# Patient Record
Sex: Female | Born: 1975 | Race: Black or African American | Hispanic: No | Marital: Single | Smoking: Current every day smoker
Health system: Southern US, Community
[De-identification: ages and names within clinical notes are randomized; demographics above are authoritative.]

## PROBLEM LIST (undated history)

## (undated) DIAGNOSIS — J45909 Unspecified asthma, uncomplicated: Secondary | ICD-10-CM

## (undated) HISTORY — DX: Unspecified asthma, uncomplicated: J45.909

---

## 2009-10-30 ENCOUNTER — Emergency Department: Payer: Self-pay | Admitting: Emergency Medicine

## 2010-12-25 ENCOUNTER — Emergency Department: Payer: Self-pay | Admitting: Emergency Medicine

## 2011-07-02 ENCOUNTER — Emergency Department: Payer: Self-pay | Admitting: Emergency Medicine

## 2011-09-07 ENCOUNTER — Emergency Department: Payer: Self-pay | Admitting: Emergency Medicine

## 2011-09-08 ENCOUNTER — Emergency Department: Payer: Self-pay | Admitting: Emergency Medicine

## 2011-09-08 LAB — PREGNANCY, URINE: Pregnancy Test, Urine: NEGATIVE m[IU]/mL

## 2011-09-08 LAB — URINALYSIS, COMPLETE
Bacteria: NONE SEEN
Glucose,UR: NEGATIVE mg/dL (ref 0–75)
Ketone: NEGATIVE
Nitrite: NEGATIVE
Protein: NEGATIVE
Specific Gravity: 1.003 (ref 1.003–1.030)
Squamous Epithelial: 1
WBC UR: 14 /HPF (ref 0–5)

## 2011-09-08 LAB — COMPREHENSIVE METABOLIC PANEL
Anion Gap: 5 — ABNORMAL LOW (ref 7–16)
Bilirubin,Total: 0.3 mg/dL (ref 0.2–1.0)
Co2: 28 mmol/L (ref 21–32)
Creatinine: 0.74 mg/dL (ref 0.60–1.30)
EGFR (Non-African Amer.): >60
Glucose: 81 mg/dL (ref 65–99)
Osmolality: 281 (ref 275–301)
Potassium: 3.6 mmol/L (ref 3.5–5.1)
SGOT(AST): 17 U/L (ref 15–37)
SGPT (ALT): 14 U/L
Sodium: 143 mmol/L (ref 136–145)
Total Protein: 6.5 g/dL (ref 6.4–8.2)

## 2011-09-08 LAB — CBC
HGB: 10.4 g/dL — ABNORMAL LOW (ref 12.0–16.0)
MCHC: 32.2 g/dL (ref 32.0–36.0)
MCV: 92 fL (ref 80–100)
RBC: 3.52 10*6/uL — ABNORMAL LOW (ref 3.80–5.20)
RDW: 15.9 % — ABNORMAL HIGH (ref 11.5–14.5)

## 2011-09-08 LAB — DRUG SCREEN, URINE
Amphetamines, Ur Screen: NEGATIVE (ref ?–1000)
Barbiturates, Ur Screen: NEGATIVE (ref ?–200)
Benzodiazepine, Ur Scrn: POSITIVE (ref ?–200)
MDMA (Ecstasy)Ur Screen: NEGATIVE (ref ?–500)

## 2011-09-08 LAB — MAGNESIUM: Magnesium: 2.1 mg/dL

## 2013-05-23 ENCOUNTER — Emergency Department: Payer: Self-pay | Admitting: Emergency Medicine

## 2013-05-23 LAB — DRUG SCREEN, URINE
Amphetamines, Ur Screen: NEGATIVE (ref ?–1000)
BARBITURATES, UR SCREEN: NEGATIVE (ref ?–200)
Benzodiazepine, Ur Scrn: NEGATIVE (ref ?–200)
Cannabinoid 50 Ng, Ur ~~LOC~~: POSITIVE (ref ?–50)
Cocaine Metabolite,Ur ~~LOC~~: NEGATIVE (ref ?–300)
MDMA (ECSTASY) UR SCREEN: NEGATIVE (ref ?–500)
Methadone, Ur Screen: NEGATIVE (ref ?–300)
Opiate, Ur Screen: NEGATIVE (ref ?–300)
PHENCYCLIDINE (PCP) UR S: NEGATIVE (ref ?–25)
Tricyclic, Ur Screen: NEGATIVE (ref ?–1000)

## 2013-05-23 LAB — ETHANOL: Ethanol: <3 mg/dL

## 2013-05-23 LAB — CBC
HCT: 38.9 % (ref 35.0–47.0)
HGB: 12.7 g/dL (ref 12.0–16.0)
MCH: 28.9 pg (ref 26.0–34.0)
MCHC: 32.7 g/dL (ref 32.0–36.0)
MCV: 88 fL (ref 80–100)
Platelet: 346 10*3/uL (ref 150–440)
RBC: 4.4 10*6/uL (ref 3.80–5.20)
RDW: 17 % — ABNORMAL HIGH (ref 11.5–14.5)
WBC: 9.3 10*3/uL (ref 3.6–11.0)

## 2013-05-23 LAB — COMPREHENSIVE METABOLIC PANEL
ALK PHOS: 99 U/L
ALT: 12 U/L (ref 12–78)
Albumin: 3.7 g/dL (ref 3.4–5.0)
Anion Gap: 3 — ABNORMAL LOW (ref 7–16)
BUN: 4 mg/dL — ABNORMAL LOW (ref 7–18)
Bilirubin,Total: 0.3 mg/dL (ref 0.2–1.0)
CHLORIDE: 109 mmol/L — AB (ref 98–107)
CREATININE: 0.81 mg/dL (ref 0.60–1.30)
Calcium, Total: 8.7 mg/dL (ref 8.5–10.1)
Co2: 26 mmol/L (ref 21–32)
EGFR (African American): >60
EGFR (Non-African Amer.): >60
Glucose: 90 mg/dL (ref 65–99)
Osmolality: 272 (ref 275–301)
POTASSIUM: 3.8 mmol/L (ref 3.5–5.1)
SGOT(AST): 15 U/L (ref 15–37)
SODIUM: 138 mmol/L (ref 136–145)
Total Protein: 8.7 g/dL — ABNORMAL HIGH (ref 6.4–8.2)

## 2013-05-23 LAB — URINALYSIS, COMPLETE
Bacteria: NONE SEEN
Bilirubin,UR: NEGATIVE
Glucose,UR: NEGATIVE mg/dL (ref 0–75)
KETONE: NEGATIVE
Leukocyte Esterase: NEGATIVE
NITRITE: NEGATIVE
PH: 6 (ref 4.5–8.0)
Protein: NEGATIVE
SPECIFIC GRAVITY: 1 (ref 1.003–1.030)
Squamous Epithelial: <1

## 2013-05-23 LAB — PREGNANCY, URINE: PREGNANCY TEST, URINE: NEGATIVE m[IU]/mL

## 2014-05-06 ENCOUNTER — Emergency Department: Payer: Self-pay | Admitting: Emergency Medicine
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
## 2020-01-28 ENCOUNTER — Ambulatory Visit (LOCAL_COMMUNITY_HEALTH_CENTER): Payer: Medicaid Other

## 2020-01-28 ENCOUNTER — Other Ambulatory Visit: Payer: Self-pay

## 2020-01-28 DIAGNOSIS — Z111 Encounter for screening for respiratory tuberculosis: Secondary | ICD-10-CM

## 2020-01-31 ENCOUNTER — Other Ambulatory Visit: Payer: Self-pay

## 2020-01-31 ENCOUNTER — Ambulatory Visit (LOCAL_COMMUNITY_HEALTH_CENTER): Payer: Medicaid Other

## 2020-01-31 VITALS — Wt 204.5 lb

## 2020-01-31 DIAGNOSIS — R7611 Nonspecific reaction to tuberculin skin test without active tuberculosis: Secondary | ICD-10-CM

## 2020-01-31 NOTE — Progress Notes (Signed)
+  PPD today.  Discussed LTBI vs Active TB. Patient unsure whether she will proceed with LTBI tx.  Will try and go for CXR tomorrow; has transportation issues.  TB RN will call patient with results and provide employer letter if CXR without Active TB. Richmond Campbell, RN

## 2020-03-01 ENCOUNTER — Ambulatory Visit
Admission: RE | Admit: 2020-03-01 | Discharge: 2020-03-01 | Disposition: A | Discharge status: Home / Self Care | Payer: Self-pay | Attending: Family Medicine | Admitting: Family Medicine

## 2020-03-01 ENCOUNTER — Other Ambulatory Visit: Payer: Self-pay | Admitting: Family Medicine

## 2020-03-01 ENCOUNTER — Ambulatory Visit
Admission: RE | Admit: 2020-03-01 | Discharge: 2020-03-01 | Disposition: A | Discharge status: Home / Self Care | Payer: Self-pay | Source: Ambulatory Visit | Attending: Family Medicine | Admitting: Family Medicine

## 2020-03-01 DIAGNOSIS — R7611 Nonspecific reaction to tuberculin skin test without active tuberculosis: Secondary | ICD-10-CM

## 2020-09-26 ENCOUNTER — Other Ambulatory Visit: Payer: Self-pay

## 2020-09-26 ENCOUNTER — Ambulatory Visit: Payer: Self-pay | Admitting: Physician Assistant

## 2020-09-26 DIAGNOSIS — Z113 Encounter for screening for infections with a predominantly sexual mode of transmission: Secondary | ICD-10-CM

## 2020-09-26 NOTE — Progress Notes (Signed)
Pt states she desires HIV testing only.

## 2020-09-27 ENCOUNTER — Encounter: Payer: Self-pay | Admitting: Physician Assistant

## 2020-09-27 NOTE — Progress Notes (Signed)
  [facility] Department STI clinic/screening visit  Subjective:  Michelle Shannon is a 45 y.o. female being seen today for an STI screening visit. The patient reports they do not have symptoms.  Patient reports that they do not desire a pregnancy in the next year.   They reported they are not interested in discussing contraception today.  No LMP recorded.   Patient has the following medical conditions:  There are no problems to display for this patient.   Chief Complaint  Patient presents with   SEXUALLY TRANSMITTED DISEASE    screening    HPI  Patient reports that she would like a HIV test today.  States that she found out that her partner has had another partner and that that partner has HIV.  Patient states that she has not had any symptoms and is not interested in any other testing today, only the test for HIV.  Reports a history of asthma but states that she does not take any medicines regularly for anything and has not had any surgeries.  States last HIV test was about 1 year ago and last pap was at that same time.   See flowsheet for further details and programmatic requirements.    The following portions of the patient's history were reviewed and updated as appropriate: allergies, current medications, past medical history, past social history, past surgical history and problem list.  Objective:  There were no vitals filed for this visit.  Physical Exam Constitutional:      General: She is not in acute distress.    Appearance: Normal appearance.  HENT:     Head: Normocephalic and atraumatic.  Eyes:     Conjunctiva/sclera: Conjunctivae normal.  Pulmonary:     Effort: Pulmonary effort is normal.  Skin:    General: Skin is warm and dry.  Neurological:     Mental Status: She is alert and oriented to person, place, and time.  Psychiatric:        Mood and Affect: Mood normal.        Behavior: Behavior normal.        Thought Content: Thought content  normal.        Judgment: Judgment normal.     Assessment and Plan:  Michelle Shannon is a 45 y.o. female presenting to the [facility] Department for STI screening  1. Screening for STD (sexually transmitted disease) Patient into clinic without symptoms. Counseled patient that she should RTC for repeat blood work in 3 months if this result is negative to confirm that she is HIV negative since last contact with her partner was less than 3 months ago. Rec condoms with all sex. Await test results.  Counseled that RN will call if needs to RTC for treatment once results are back.  - [facility]     No follow-ups on file.  No future appointments.  Matt Holmes, PA

## 2020-09-29 LAB — HM HIV SCREENING LAB: HM HIV Screening: NEGATIVE
